# Patient Record
Sex: Male | Born: 1995 | Race: White | Hispanic: No | Marital: Single
Health system: Southern US, Community
[De-identification: ages and names within clinical notes are randomized; demographics above are authoritative.]

## PROBLEM LIST (undated history)

## (undated) DIAGNOSIS — F909 Attention-deficit hyperactivity disorder, unspecified type: Secondary | ICD-10-CM

---
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
## 2016-04-21 ENCOUNTER — Encounter: Payer: Medicaid Other | Admitting: Family Medicine

## 2016-04-29 ENCOUNTER — Encounter: Payer: Self-pay | Admitting: Family Medicine

## 2016-10-03 ENCOUNTER — Encounter (HOSPITAL_COMMUNITY): Payer: Self-pay

## 2016-10-03 ENCOUNTER — Emergency Department (HOSPITAL_COMMUNITY)
Admission: EM | Admit: 2016-10-03 | Discharge: 2016-10-03 | Disposition: A | Discharge status: Home / Self Care | Payer: Medicaid Other | Attending: Emergency Medicine | Admitting: Emergency Medicine

## 2016-10-03 DIAGNOSIS — R197 Diarrhea, unspecified: Secondary | ICD-10-CM

## 2016-10-03 DIAGNOSIS — Z79899 Other long term (current) drug therapy: Secondary | ICD-10-CM | POA: Insufficient documentation

## 2016-10-03 DIAGNOSIS — R112 Nausea with vomiting, unspecified: Secondary | ICD-10-CM

## 2016-10-03 DIAGNOSIS — E860 Dehydration: Secondary | ICD-10-CM | POA: Insufficient documentation

## 2016-10-03 DIAGNOSIS — Z7722 Contact with and (suspected) exposure to environmental tobacco smoke (acute) (chronic): Secondary | ICD-10-CM | POA: Insufficient documentation

## 2016-10-03 DIAGNOSIS — N179 Acute kidney failure, unspecified: Secondary | ICD-10-CM | POA: Insufficient documentation

## 2016-10-03 LAB — CBC WITH DIFFERENTIAL/PLATELET
BASOS ABS: 0 10*3/uL (ref 0.0–0.1)
BASOS PCT: 0 %
EOS ABS: 0 10*3/uL (ref 0.0–0.7)
Eosinophils Relative: 0 %
HCT: 50.7 % (ref 39.0–52.0)
HEMOGLOBIN: 18.8 g/dL — AB (ref 13.0–17.0)
Lymphocytes Relative: 5 %
Lymphs Abs: 0.8 10*3/uL (ref 0.7–4.0)
MCH: 32.7 pg (ref 26.0–34.0)
MCHC: 37.1 g/dL — ABNORMAL HIGH (ref 30.0–36.0)
MCV: 88.2 fL (ref 78.0–100.0)
Monocytes Absolute: 1.3 10*3/uL — ABNORMAL HIGH (ref 0.1–1.0)
Monocytes Relative: 8 %
NEUTROS PCT: 87 %
Neutro Abs: 14 10*3/uL — ABNORMAL HIGH (ref 1.7–7.7)
Platelets: 213 10*3/uL (ref 150–400)
RBC: 5.75 MIL/uL (ref 4.22–5.81)
RDW: 12.3 % (ref 11.5–15.5)
WBC: 16.1 10*3/uL — AB (ref 4.0–10.5)

## 2016-10-03 LAB — BASIC METABOLIC PANEL
Anion gap: 18 — ABNORMAL HIGH (ref 5–15)
Anion gap: 6 (ref 5–15)
BUN: 36 mg/dL — ABNORMAL HIGH (ref 6–20)
BUN: 29 mg/dL — AB (ref 6–20)
CALCIUM: 10 mg/dL (ref 8.9–10.3)
CALCIUM: 8.2 mg/dL — AB (ref 8.9–10.3)
CHLORIDE: 98 mmol/L — AB (ref 101–111)
CO2: 21 mmol/L — ABNORMAL LOW (ref 22–32)
CO2: 24 mmol/L (ref 22–32)
CREATININE: 2.04 mg/dL — AB (ref 0.61–1.24)
Chloride: 106 mmol/L (ref 101–111)
Creatinine, Ser: 1.44 mg/dL — ABNORMAL HIGH (ref 0.61–1.24)
GFR calc Af Amer: >60 mL/min (ref >60)
GFR, EST AFRICAN AMERICAN: 53 mL/min — AB (ref >60)
GFR, EST NON AFRICAN AMERICAN: 46 mL/min — AB (ref >60)
GLUCOSE: 122 mg/dL — AB (ref 65–99)
Glucose, Bld: 187 mg/dL — ABNORMAL HIGH (ref 65–99)
POTASSIUM: 4.9 mmol/L (ref 3.5–5.1)
Potassium: 4 mmol/L (ref 3.5–5.1)
SODIUM: 137 mmol/L (ref 135–145)
Sodium: 136 mmol/L (ref 135–145)

## 2016-10-03 LAB — URINALYSIS, ROUTINE W REFLEX MICROSCOPIC
Bacteria, UA: NONE SEEN
Bilirubin Urine: NEGATIVE
GLUCOSE, UA: NEGATIVE mg/dL
KETONES UR: NEGATIVE mg/dL
Leukocytes, UA: NEGATIVE
NITRITE: NEGATIVE
PH: 5 (ref 5.0–8.0)
PROTEIN: 30 mg/dL — AB
Specific Gravity, Urine: 1.021 (ref 1.005–1.030)

## 2016-10-03 MED ORDER — ONDANSETRON HCL 4 MG/2ML IJ SOLN
INTRAMUSCULAR | Status: AC
  Administered 2016-10-03: 4 mg
  Filled 2016-10-03: qty 2

## 2016-10-03 MED ORDER — SODIUM CHLORIDE 0.9 % IV BOLUS (SEPSIS)
1000.0000 mL | Freq: Once | INTRAVENOUS | Status: AC
  Administered 2016-10-03: 1000 mL via INTRAVENOUS

## 2016-10-03 MED ORDER — ONDANSETRON HCL 4 MG PO TABS: 4.0000 mg | ORAL_TABLET | Freq: Four times a day (QID) | ORAL | 0 refills | Status: DC | PRN

## 2016-10-03 NOTE — Discharge Instructions (Signed)
Drink plenty of fluids for the next several days. °

## 2016-10-03 NOTE — ED Notes (Signed)
Patient states that he is feeling better at this time.

## 2016-10-03 NOTE — ED Triage Notes (Signed)
Mother states that he has been vomiting every 30 minutes for the past 24 hours.  Did have diarrhea yesterday.

## 2016-10-03 NOTE — ED Provider Notes (Signed)
[facility]Y DEPT Provider Note   CSN: [id number] Arrival date & time: 10/03/16  0126     History   Chief Complaint Chief Complaint  Patient presents with  . Emesis    HPI Gregory English is a 21 y.o. male.  He had onset about 4 PM of nausea, vomiting, diarrhea. There is abdominal cramping but no steady abdominal pain. He denies fever but has had chills and sweats. He denies any sick contacts. Has been no treatment at home. Mother states that he has been vomiting about every 20-30 minutes.    Emesis      Past Medical History:  Diagnosis Date  . ADD (attention deficit disorder with hyperactivity)     Patient Active Problem List   Diagnosis Date Noted  . ADD (attention deficit disorder) 03/19/2013  . 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 No pertinent surgical history.     Home Medications    Prior to Admission medications   Medication Sig Start Date End Date Taking? Authorizing Provider  methylphenidate 36 MG PO CR tablet Take 1 tablet (36 mg total) by mouth every morning. 12/22/15   Babs Sciara, MD    Family History Family History  Problem Relation Age of Onset  . Arthritis    . Cancer    . Diabetes    . Kidney disease      Social History Social History  Substance Use Topics  . Smoking status: Passive Smoke Exposure - Never Smoker  . Smokeless tobacco: Never Used  . Alcohol use No     Allergies   Patient has no known allergies.   Review of Systems Review of Systems  Gastrointestinal: Positive for vomiting.  All other systems reviewed and are negative.    Physical Exam Updated Vital Signs BP 96/69 (BP Location: Right Arm)   Pulse (!) 142   Temp 98.3 F (36.8 C) (Oral)   Ht 6' (1.829 m)   Wt 165 lb (74.8 kg)   SpO2 100%   BMI 22.38 kg/m   Physical Exam  Nursing note and vitals reviewed.  21 year old male, resting comfortably and in no acute distress. Vital signs are significant for tachycardia. Oxygen  saturation is 100%, which is normal. Head is normocephalic and atraumatic. PERRLA, EOMI. Oropharynx is clear. Neck is nontender and supple without adenopathy or JVD. Back is nontender and there is no CVA tenderness. Lungs are clear without rales, wheezes, or rhonchi. Chest is nontender. Heart has regular rate and rhythm without murmur. Abdomen is soft, flat, nontender without masses or hepatosplenomegaly and peristalsis is hypoactive. Extremities have no cyanosis or edema, full range of motion is present. Skin is warm and dry without rash. Neurologic: Mental status is normal, cranial nerves are intact, there are no motor or sensory deficits.  ED Treatments / Results  Labs (all labs ordered are listed, but only abnormal results are displayed) Labs Reviewed  BASIC METABOLIC PANEL - Abnormal; Notable for the following:       Result Value   Chloride 98 (*)    CO2 21 (*)    Glucose, Bld 187 (*)    BUN 36 (*)    Creatinine, Ser 2.04 (*)    GFR calc non Af Amer 46 (*)    GFR calc Af Amer 53 (*)    Anion gap 18 (*)    All other components within normal limits  CBC WITH DIFFERENTIAL/PLATELET - Abnormal; Notable for the following:    WBC 16.1 (*)  Hemoglobin 18.8 (*)    MCHC 37.1 (*)    Neutro Abs 14.0 (*)    Monocytes Absolute 1.3 (*)    All other components within normal limits  URINALYSIS, ROUTINE W REFLEX MICROSCOPIC - Abnormal; Notable for the following:    APPearance HAZY (*)    Hgb urine dipstick SMALL (*)    Protein, ur 30 (*)    All other components within normal limits  BASIC METABOLIC PANEL - Abnormal; Notable for the following:    Glucose, Bld 122 (*)    BUN 29 (*)    Creatinine, Ser 1.44 (*)    Calcium 8.2 (*)    All other components within normal limits   Procedures Procedures (including critical care time)  Medications Ordered in ED Medications  ondansetron (ZOFRAN) 4 MG/2ML injection (not administered)  sodium chloride 0.9 % bolus 1,000 mL (not  administered)   Initial Impression / Assessment and Plan / ED Course  I have reviewed the triage vital signs and the nursing notes.  Pertinent labs & imaging results that were available during my care of the patient were reviewed by me and considered in my medical decision making (see chart for details).  Nausea, vomiting, diarrhea. Pattern is most consistent with viral gastroenteritis, consider food poisoning. No red flags to suggest more serious pathology. Screening labs are obtained and he will be given IV fluids and ondansetron.  Following above-noted treatment, he was feeling much better. Color had improved and he was no longer nauseated. However, laboratory workup showed significant renal insufficiency with creatinine 2.04 and BUN of 36, and elevated anion gap of 18. Patient was clinically doing well. He was given 2 more liters of IV fluids, and metabolic panel repeated. Creatinine had fallen to 1.44, BUN fallen to 29, and anion gap was down to 6. He was felt to be safe for discharge at this point. He is discharged with prescription for ondansetron and is advised to force fluids for the next several days. He is to see his PCP in the next week to recheck his metabolic panel.  Final Clinical Impressions(s) / ED Diagnoses   Final diagnoses:  Nausea vomiting and diarrhea  Dehydration  Acute kidney injury (nontraumatic) (HCC)    New Prescriptions New Prescriptions   ONDANSETRON (ZOFRAN) 4 MG TABLET    Take 1 tablet (4 mg total) by mouth every 6 (six) hours as needed.   ONDANSETRON (ZOFRAN) 4 MG TABLET    Take 1 tablet (4 mg total) by mouth every 6 (six) hours as needed.     Dione Boozeavid Luca Burston, MD 10/03/16 [phone]

## 2016-10-04 ENCOUNTER — Other Ambulatory Visit: Payer: Self-pay

## 2016-10-04 DIAGNOSIS — E860 Dehydration: Secondary | ICD-10-CM

## 2016-10-06 LAB — URINALYSIS
Bilirubin, UA: NEGATIVE
Glucose, UA: NEGATIVE
KETONES UA: NEGATIVE
LEUKOCYTES UA: NEGATIVE
NITRITE UA: NEGATIVE
PH UA: 5.5 (ref 5.0–7.5)
Protein, UA: NEGATIVE
RBC, UA: NEGATIVE
Specific Gravity, UA: 1.024 (ref 1.005–1.030)
Urobilinogen, Ur: 0.2 mg/dL (ref 0.2–1.0)

## 2016-10-06 LAB — BASIC METABOLIC PANEL
BUN / CREAT RATIO: 9 (ref 9–20)
BUN: 9 mg/dL (ref 6–20)
CO2: 24 mmol/L (ref 18–29)
Calcium: 9.3 mg/dL (ref 8.7–10.2)
Chloride: 103 mmol/L (ref 96–106)
Creatinine, Ser: 0.95 mg/dL (ref 0.76–1.27)
GFR, EST AFRICAN AMERICAN: 134 mL/min/{1.73_m2} (ref >59)
GFR, EST NON AFRICAN AMERICAN: 116 mL/min/{1.73_m2} (ref >59)
Glucose: 93 mg/dL (ref 65–99)
Potassium: 4.7 mmol/L (ref 3.5–5.2)
Sodium: 142 mmol/L (ref 134–144)

## 2016-10-07 ENCOUNTER — Ambulatory Visit (INDEPENDENT_AMBULATORY_CARE_PROVIDER_SITE_OTHER): Payer: Self-pay | Admitting: Family Medicine

## 2016-10-07 ENCOUNTER — Encounter: Payer: Self-pay | Admitting: Family Medicine

## 2016-10-07 VITALS — BP 100/74 | Temp 98.2°F | Ht 71.0 in | Wt 155.5 lb

## 2016-10-07 DIAGNOSIS — N289 Disorder of kidney and ureter, unspecified: Secondary | ICD-10-CM

## 2016-10-07 NOTE — Progress Notes (Signed)
   Subjective:    Patient ID: Gregory JubileeJonathan R Marcil, male    DOB: 06/10/1996, 21 y.o.   MRN: [id number]  HPI Patient is here today for a ER follow up visit. [facility] for nausea and vomiting. Patient recently had blood work completed. Patient states that he feels a lot better.  Patient has no other concerns at this time.  Patient had sudden onset of vomiting diarrhea not feeling good in the depth going to the ER diagnosed her renal insufficiency severe dehydration was given IV fluids he's doing much better now he is here for follow-up E didn't follow-up lab work and urinalysis which did not show any hematuria or proteinuria and creatinine looks good  Review of Systems  Constitutional: Negative for chills, diaphoresis, fatigue and fever.  HENT: Negative for congestion.   Respiratory: Negative for cough and shortness of breath.   Cardiovascular: Negative for chest pain.  Gastrointestinal: Negative for abdominal pain.       Objective:   Physical Exam  Constitutional: He appears well-nourished. No distress.  Cardiovascular: Normal rate, regular rhythm and normal heart sounds.   No murmur heard. Pulmonary/Chest: Effort normal and breath sounds normal. No respiratory distress.  Abdominal: Soft. He exhibits no distension. There is no tenderness. There is no rebound and no guarding.  Musculoskeletal: He exhibits no edema.  Lymphadenopathy:    He has no cervical adenopathy.  Neurological: He is alert.  Psychiatric: His behavior is normal.  Vitals reviewed.         Assessment & Plan:  Recent viral gastroenteritis resolved Recent renal is in insufficiency resolved No need to do any further lab testing at that time follow-up if problems

## 2018-01-24 ENCOUNTER — Encounter: Payer: Self-pay | Admitting: Family Medicine

## 2018-01-24 ENCOUNTER — Ambulatory Visit: Payer: Self-pay | Admitting: Family Medicine

## 2018-01-24 VITALS — BP 120/78 | Temp 98.1°F | Ht 71.0 in | Wt 159.0 lb

## 2018-01-24 DIAGNOSIS — H6123 Impacted cerumen, bilateral: Secondary | ICD-10-CM

## 2018-01-24 NOTE — Progress Notes (Signed)
   Subjective:    Patient ID: Gregory English, male    DOB: 08-Sep-1995, 22 y.o.   MRN: [id number]  HPI  Patient is here today to discuss issues with ear wax buildup in both ears.He states it has been on going all his life. Bilateral difficulty hearing in the ears.  Denies high fever chills sweats Review of Systems No wheezing no difficulty breathing no fevers no vomiting    Objective:   Physical Exam  Bilateral cerumen impaction neck no masses lungs clear crackles are not present heart is regular no murmurs      Assessment & Plan:  Bilateral cerumen impaction Referral to ENT for further evaluation Follow-up if ongoing troubles

## 2018-01-24 NOTE — Patient Instructions (Signed)
As part of today's visit a referral has been made. This is a process that is handled by our clinical referral specialists. This process requires that we send your medical information to the specialists for their review before they will issue you an appointment.  Unfortunately this does take time and much of this process is under the responsibility of the specialists.  For emergent referrals we do our best to speed up this process. Our referral specialist will make certain that your insurance company preferred list is taken into account. Emergent referrals are made as quick as possible.  Most standard referrals often take 10-14 days before the specialists office will connect with you to set up the appointment.. If you have not heard when your appointment is from us or the referral specialists within 10-14 days please call us regarding this referral.   

## 2020-09-18 ENCOUNTER — Encounter (HOSPITAL_COMMUNITY): Payer: Self-pay | Admitting: Emergency Medicine

## 2020-09-18 ENCOUNTER — Other Ambulatory Visit: Payer: Self-pay

## 2020-09-18 DIAGNOSIS — E860 Dehydration: Secondary | ICD-10-CM | POA: Insufficient documentation

## 2020-09-18 DIAGNOSIS — Z7722 Contact with and (suspected) exposure to environmental tobacco smoke (acute) (chronic): Secondary | ICD-10-CM | POA: Insufficient documentation

## 2020-09-18 DIAGNOSIS — R1010 Upper abdominal pain, unspecified: Secondary | ICD-10-CM | POA: Insufficient documentation

## 2020-09-18 DIAGNOSIS — R197 Diarrhea, unspecified: Secondary | ICD-10-CM | POA: Insufficient documentation

## 2020-09-18 DIAGNOSIS — R112 Nausea with vomiting, unspecified: Secondary | ICD-10-CM | POA: Insufficient documentation

## 2020-09-18 LAB — CBC
HCT: 48.8 % (ref 39.0–52.0)
Hemoglobin: 17 g/dL (ref 13.0–17.0)
MCH: 31.4 pg (ref 26.0–34.0)
MCHC: 34.8 g/dL (ref 30.0–36.0)
MCV: 90.2 fL (ref 80.0–100.0)
Platelets: 183 10*3/uL (ref 150–400)
RBC: 5.41 MIL/uL (ref 4.22–5.81)
RDW: 12 % (ref 11.5–15.5)
WBC: 12.4 10*3/uL — ABNORMAL HIGH (ref 4.0–10.5)
nRBC: 0 % (ref 0.0–0.2)

## 2020-09-18 LAB — COMPREHENSIVE METABOLIC PANEL
ALT: 35 U/L (ref 0–44)
AST: 36 U/L (ref 15–41)
Albumin: 4.7 g/dL (ref 3.5–5.0)
Alkaline Phosphatase: 53 U/L (ref 38–126)
Anion gap: 11 (ref 5–15)
BUN: 18 mg/dL (ref 6–20)
CO2: 27 mmol/L (ref 22–32)
Calcium: 9.5 mg/dL (ref 8.9–10.3)
Chloride: 97 mmol/L — ABNORMAL LOW (ref 98–111)
Creatinine, Ser: 1.25 mg/dL — ABNORMAL HIGH (ref 0.61–1.24)
GFR, Estimated: >60 mL/min (ref >60)
Glucose, Bld: 155 mg/dL — ABNORMAL HIGH (ref 70–99)
Potassium: 4.4 mmol/L (ref 3.5–5.1)
Sodium: 135 mmol/L (ref 135–145)
Total Bilirubin: 0.7 mg/dL (ref 0.3–1.2)
Total Protein: 8.1 g/dL (ref 6.5–8.1)

## 2020-09-18 LAB — LIPASE, BLOOD: Lipase: 25 U/L (ref 11–51)

## 2020-09-18 NOTE — ED Triage Notes (Signed)
Pt states he started vomiting since about 1730 this afternoon. Denies COVID exposure.

## 2020-09-19 ENCOUNTER — Emergency Department (HOSPITAL_COMMUNITY)
Admission: EM | Admit: 2020-09-19 | Discharge: 2020-09-19 | Disposition: A | Discharge status: Home / Self Care | Payer: Self-pay | Attending: Emergency Medicine | Admitting: Emergency Medicine

## 2020-09-19 DIAGNOSIS — R112 Nausea with vomiting, unspecified: Secondary | ICD-10-CM

## 2020-09-19 DIAGNOSIS — E860 Dehydration: Secondary | ICD-10-CM

## 2020-09-19 DIAGNOSIS — R197 Diarrhea, unspecified: Secondary | ICD-10-CM

## 2020-09-19 MED ORDER — ONDANSETRON HCL 4 MG PO TABS: 4.0000 mg | ORAL_TABLET | Freq: Three times a day (TID) | ORAL | 0 refills | Status: DC | PRN

## 2020-09-19 MED ORDER — SODIUM CHLORIDE 0.9 % IV BOLUS
1000.0000 mL | Freq: Once | INTRAVENOUS | Status: AC
  Administered 2020-09-19: 1000 mL via INTRAVENOUS

## 2020-09-19 MED ORDER — ONDANSETRON HCL 4 MG/2ML IJ SOLN
4.0000 mg | Freq: Once | INTRAMUSCULAR | Status: AC
  Administered 2020-09-19: 4 mg via INTRAVENOUS
  Filled 2020-09-19: qty 2

## 2020-09-19 NOTE — ED Notes (Signed)
Pt tolerating water intake.

## 2020-09-19 NOTE — ED Provider Notes (Signed)
[facility] EMERGENCY DEPARTMENT Provider Note   CSN: [id number] Arrival date & time: 09/18/20  2047   Time seen 2:15 AM  History Chief Complaint  Patient presents with  . Emesis    Gregory English is a 25 y.o. male.  HPI   Patient states he was fine all day and at 5:30 PM tonight, January 20 he started vomiting twice and then has been having dry heaves.  He had diarrhea 3-4 times.  He states he had some mild upper abdominal pain but that is gone now.  He states it was constant and a pressure feeling.  He denies fever, cough, sore throat, feeling dizzy or lightheaded.  At the time of my exam he states he was feeling better.  PCP Babs Sciara, MD    Past Medical History:  Diagnosis Date  . ADD (attention deficit disorder with hyperactivity)     Patient Active Problem List   Diagnosis Date Noted  . ADD (attention deficit disorder) 03/19/2013  . 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 No pertinent surgical history.     Family History  Problem Relation Age of Onset  . Arthritis Other   . Cancer Other   . Diabetes Other   . Kidney disease Other     Social History   Tobacco Use  . Smoking status: Passive Smoke Exposure - Never Smoker  . Smokeless tobacco: Never Used  Substance Use Topics  . Alcohol use: No  . Drug use: No    Home Medications Prior to Admission medications   Medication Sig Start Date End Date Taking? Authorizing Provider  ondansetron (ZOFRAN) 4 MG tablet Take 1 tablet (4 mg total) by mouth every 8 (eight) hours as needed. 09/19/20  Yes Devoria Albe, MD    Allergies    Patient has no known allergies.  Review of Systems   Review of Systems  All other systems reviewed and are negative.   Physical Exam Updated Vital Signs BP 115/75   Pulse 86   Temp 98.6 F (37 C)   Resp 14   SpO2 99%   Physical Exam Vitals and nursing note reviewed.  Constitutional:      General: He is not in acute distress.    Appearance:  Normal appearance. He is normal weight.     Comments: Patient was sleeping when I entered the room, he did not awaken to verbal stimulus I had to shake his foot.  HENT:     Head: Normocephalic and atraumatic.     Right Ear: External ear normal.     Left Ear: External ear normal.  Eyes:     Extraocular Movements: Extraocular movements intact.     Conjunctiva/sclera: Conjunctivae normal.     Pupils: Pupils are equal, round, and reactive to light.  Cardiovascular:     Rate and Rhythm: Normal rate and regular rhythm.     Pulses: Normal pulses.     Heart sounds: Normal heart sounds.  Pulmonary:     Effort: Pulmonary effort is normal.  Abdominal:     General: Abdomen is flat.     Palpations: Abdomen is soft.     Tenderness: There is no abdominal tenderness.  Musculoskeletal:        General: Normal range of motion.     Cervical back: Normal range of motion.  Skin:    General: Skin is warm and dry.  Neurological:     General: No focal deficit present.     Mental  Status: He is alert and oriented to person, place, and time.     Cranial Nerves: No cranial nerve deficit.  Psychiatric:        Mood and Affect: Mood normal.        Behavior: Behavior normal.        Thought Content: Thought content normal.     ED Results / Procedures / Treatments   Labs (all labs ordered are listed, but only abnormal results are displayed) Results for orders placed or performed during the hospital encounter of 09/19/20  Lipase, blood  Result Value Ref Range   Lipase 25 11 - 51 U/L  Comprehensive metabolic panel  Result Value Ref Range   Sodium 135 135 - 145 mmol/L   Potassium 4.4 3.5 - 5.1 mmol/L   Chloride 97 (L) 98 - 111 mmol/L   CO2 27 22 - 32 mmol/L   Glucose, Bld 155 (H) 70 - 99 mg/dL   BUN 18 6 - 20 mg/dL   Creatinine, Ser 7.82 (H) 0.61 - 1.24 mg/dL   Calcium 9.5 8.9 - 42.3 mg/dL   Total Protein 8.1 6.5 - 8.1 g/dL   Albumin 4.7 3.5 - 5.0 g/dL   AST 36 15 - 41 U/L   ALT 35 0 - 44 U/L    Alkaline Phosphatase 53 38 - 126 U/L   Total Bilirubin 0.7 0.3 - 1.2 mg/dL   GFR, Estimated >53 >61 mL/min   Anion gap 11 5 - 15  CBC  Result Value Ref Range   WBC 12.4 (H) 4.0 - 10.5 K/uL   RBC 5.41 4.22 - 5.81 MIL/uL   Hemoglobin 17.0 13.0 - 17.0 g/dL   HCT 44.3 15.4 - 00.8 %   MCV 90.2 80.0 - 100.0 fL   MCH 31.4 26.0 - 34.0 pg   MCHC 34.8 30.0 - 36.0 g/dL   RDW 67.6 19.5 - 09.3 %   Platelets 183 150 - 400 K/uL   nRBC 0.0 0.0 - 0.2 %   Laboratory interpretation all normal except leukocytosis consistent with vomiting, hyperglycemia    EKG EKG Interpretation  Date/Time:  Friday September 19 2020 00:54:27 EST Ventricular Rate:  85 PR Interval:    QRS Duration: 84 QT Interval:  340 QTC Calculation: 405 R Axis:   102 Text Interpretation: Sinus rhythm Right atrial enlargement Borderline right axis deviation Borderline T wave abnormalities Borderline ST elevation, anterolateral leads No old tracing to compare Confirmed by Devoria Albe ([id number]) on 09/19/2020 1:57:49 AM   Radiology No results found.  Procedures Procedures (including critical care time)  Medications Ordered in ED Medications  sodium chloride 0.9 % bolus 1,000 mL (0 mLs Intravenous Stopped 09/19/20 0455)  ondansetron (ZOFRAN) injection 4 mg (4 mg Intravenous Given 09/19/20 0238)  sodium chloride 0.9 % bolus 1,000 mL (1,000 mLs Intravenous New Bag/Given 09/19/20 0453)    ED Course  I have reviewed the triage vital signs and the nursing notes.  Pertinent labs & imaging results that were available during my care of the patient were reviewed by me and considered in my medical decision making (see chart for details).    MDM Rules/Calculators/A&P                         At the time of my exam patient stated he felt better.  I ordered IV fluids and he wanted to try oral intake.  However almost as soon as I left the room the nurses said he  reported he started getting very nauseated and started vomiting again.  He was  given Zofran IV.  Patient does have a slight increase of his creatinine with a normal GFR consistent with dehydration.  Recheck at 5:00 AM patient is awake, he states he feels better.  He has been drinking fluids without issue.  He feels ready to be discharged.  Final Clinical Impression(s) / ED Diagnoses Final diagnoses:  Nausea vomiting and diarrhea  Dehydration    Rx / DC Orders ED Discharge Orders         Ordered    ondansetron (ZOFRAN) 4 MG tablet  Every 8 hours PRN        09/19/20 0525         Plan discharge  Devoria Albe, MD, Concha Pyo, MD 09/19/20 [phone]

## 2020-09-19 NOTE — Discharge Instructions (Addendum)
Drink plenty of fluids (clear liquids) then start a bland diet later this morning such as toast, crackers, jello, [employer] chicken noodle soup. Use the zofran for nausea or vomiting. Take imodium OTC for diarrhea. Avoid milk products until the diarrhea is gone. ° °

## 2023-02-26 ENCOUNTER — Encounter (HOSPITAL_COMMUNITY): Payer: Self-pay

## 2023-02-26 ENCOUNTER — Other Ambulatory Visit: Payer: Self-pay

## 2023-02-26 ENCOUNTER — Emergency Department (HOSPITAL_COMMUNITY)
Admission: EM | Admit: 2023-02-26 | Discharge: 2023-02-26 | Disposition: A | Discharge status: Home / Self Care | Payer: Self-pay | Attending: Emergency Medicine | Admitting: Emergency Medicine

## 2023-02-26 DIAGNOSIS — X30XXXA Exposure to excessive natural heat, initial encounter: Secondary | ICD-10-CM | POA: Insufficient documentation

## 2023-02-26 DIAGNOSIS — T675XXA Heat exhaustion, unspecified, initial encounter: Secondary | ICD-10-CM | POA: Insufficient documentation

## 2023-02-26 DIAGNOSIS — R7989 Other specified abnormal findings of blood chemistry: Secondary | ICD-10-CM | POA: Insufficient documentation

## 2023-02-26 LAB — URINALYSIS, ROUTINE W REFLEX MICROSCOPIC
Bacteria, UA: NONE SEEN
Bilirubin Urine: NEGATIVE
Glucose, UA: NEGATIVE mg/dL
Hgb urine dipstick: NEGATIVE
Ketones, ur: 5 mg/dL — AB
Leukocytes,Ua: NEGATIVE
Nitrite: NEGATIVE
Protein, ur: 100 mg/dL — AB
Specific Gravity, Urine: 1.032 — ABNORMAL HIGH (ref 1.005–1.030)
pH: 5 (ref 5.0–8.0)

## 2023-02-26 LAB — CBC WITH DIFFERENTIAL/PLATELET
Abs Immature Granulocytes: 0.12 10*3/uL — ABNORMAL HIGH (ref 0.00–0.07)
Basophils Absolute: 0.1 10*3/uL (ref 0.0–0.1)
Basophils Relative: 1 %
Eosinophils Absolute: 0 10*3/uL (ref 0.0–0.5)
Eosinophils Relative: 0 %
HCT: 43.9 % (ref 39.0–52.0)
Hemoglobin: 16 g/dL (ref 13.0–17.0)
Immature Granulocytes: 1 %
Lymphocytes Relative: 12 %
Lymphs Abs: 1.5 10*3/uL (ref 0.7–4.0)
MCH: 31.4 pg (ref 26.0–34.0)
MCHC: 36.4 g/dL — ABNORMAL HIGH (ref 30.0–36.0)
MCV: 86.2 fL (ref 80.0–100.0)
Monocytes Absolute: 1.2 10*3/uL — ABNORMAL HIGH (ref 0.1–1.0)
Monocytes Relative: 10 %
Neutro Abs: 9.5 10*3/uL — ABNORMAL HIGH (ref 1.7–7.7)
Neutrophils Relative %: 76 %
Platelets: 237 10*3/uL (ref 150–400)
RBC: 5.09 MIL/uL (ref 4.22–5.81)
RDW: 12 % (ref 11.5–15.5)
WBC: 12.4 10*3/uL — ABNORMAL HIGH (ref 4.0–10.5)
nRBC: 0 % (ref 0.0–0.2)

## 2023-02-26 LAB — COMPREHENSIVE METABOLIC PANEL
ALT: 30 U/L (ref 0–44)
AST: 37 U/L (ref 15–41)
Albumin: 5.1 g/dL — ABNORMAL HIGH (ref 3.5–5.0)
Alkaline Phosphatase: 60 U/L (ref 38–126)
Anion gap: 13 (ref 5–15)
BUN: 15 mg/dL (ref 6–20)
CO2: 24 mmol/L (ref 22–32)
Calcium: 10 mg/dL (ref 8.9–10.3)
Chloride: 100 mmol/L (ref 98–111)
Creatinine, Ser: 1.35 mg/dL — ABNORMAL HIGH (ref 0.61–1.24)
GFR, Estimated: >60 mL/min (ref >60)
Glucose, Bld: 85 mg/dL (ref 70–99)
Potassium: 3.5 mmol/L (ref 3.5–5.1)
Sodium: 137 mmol/L (ref 135–145)
Total Bilirubin: 1.3 mg/dL — ABNORMAL HIGH (ref 0.3–1.2)
Total Protein: 8.8 g/dL — ABNORMAL HIGH (ref 6.5–8.1)

## 2023-02-26 LAB — MAGNESIUM: Magnesium: 2.1 mg/dL (ref 1.7–2.4)

## 2023-02-26 LAB — CK: Total CK: 292 U/L (ref 49–397)

## 2023-02-26 MED ORDER — SODIUM CHLORIDE 0.9 % IV BOLUS
1000.0000 mL | Freq: Once | INTRAVENOUS | Status: AC
  Administered 2023-02-26: 1000 mL via INTRAVENOUS

## 2023-02-26 MED ORDER — ONDANSETRON HCL 4 MG/2ML IJ SOLN: 4.0000 mg | Freq: Once | INTRAMUSCULAR | Status: DC

## 2023-02-26 NOTE — ED Notes (Signed)
Unable to give ua sample at this time.

## 2023-02-26 NOTE — ED Triage Notes (Signed)
Pt to ED from working out in sun all day,pt says he feels weak and shaking, says he has been drinking fluids and urine is not dark, but feels shaky and weak.

## 2023-02-26 NOTE — ED Provider Notes (Signed)
EMERGENCY DEPARTMENT AT [facility] Provider Note   CSN: [id number] Arrival date & time: 02/26/23  2049     History {Add pertinent medical, surgical, social history, OB history to HPI:1} Chief Complaint  Patient presents with   Heat Exposure    KALEP SUMMERTON is a 27 y.o. male.  Pt is a 27 yo male with pmhx significant for ADHD.  He was outside in the heat working on his truck.  He has drank some fluids, but came into the house and feels weak, shaky, and nauseous.  He was concerned he was dehydrated.       Home Medications Prior to Admission medications   Medication Sig Start Date End Date Taking? Authorizing Provider  ondansetron (ZOFRAN) 4 MG tablet Take 1 tablet (4 mg total) by mouth every 8 (eight) hours as needed. 09/19/20   Devoria Albe, MD      Allergies    Patient has no known allergies.    Review of Systems   Review of Systems  Constitutional:  Positive for chills.  Gastrointestinal:  Positive for nausea.  Neurological:  Positive for weakness.  All other systems reviewed and are negative.   Physical Exam Updated Vital Signs BP 126/89   Pulse 82   Temp (!) 97.4 F (36.3 C) (Oral)   Resp 17   Ht 6' (1.829 m)   Wt 79.4 kg   SpO2 96%   BMI 23.73 kg/m  Physical Exam Vitals and nursing note reviewed.  Constitutional:      Appearance: Normal appearance.  HENT:     Head: Normocephalic and atraumatic.     Right Ear: External ear normal.     Left Ear: External ear normal.     Nose: Nose normal.     Mouth/Throat:     Mouth: Mucous membranes are dry.  Eyes:     Extraocular Movements: Extraocular movements intact.     Conjunctiva/sclera: Conjunctivae normal.     Pupils: Pupils are equal, round, and reactive to light.  Cardiovascular:     Rate and Rhythm: Normal rate and regular rhythm.     Pulses: Normal pulses.     Heart sounds: Normal heart sounds.  Pulmonary:     Effort: Pulmonary effort is normal.     Breath sounds: Normal  breath sounds.  Abdominal:     General: Abdomen is flat. Bowel sounds are normal.     Palpations: Abdomen is soft.  Musculoskeletal:        General: Normal range of motion.     Cervical back: Normal range of motion and neck supple.  Skin:    General: Skin is warm.     Capillary Refill: Capillary refill takes less than 2 seconds.  Neurological:     General: No focal deficit present.     Mental Status: He is alert and oriented to person, place, and time.  Psychiatric:        Mood and Affect: Mood normal.        Behavior: Behavior normal.     ED Results / Procedures / Treatments   Labs (all labs ordered are listed, but only abnormal results are displayed) Labs Reviewed  COMPREHENSIVE METABOLIC PANEL  CBC WITH DIFFERENTIAL/PLATELET  MAGNESIUM  URINALYSIS, ROUTINE W REFLEX MICROSCOPIC  CK    EKG None  Radiology No results found.  Procedures Procedures  {Document cardiac monitor, telemetry assessment procedure when appropriate:1}  Medications Ordered in ED Medications  sodium chloride 0.9 % bolus 1,000  mL (has no administration in time range)    ED Course/ Medical Decision Making/ A&P   {   Click here for ABCD2, HEART and other calculatorsREFRESH Note before signing :1}                          Medical Decision Making Amount and/or Complexity of Data Reviewed Labs: ordered.  Risk Prescription drug management.   This patient presents to the ED for concern of dehydration, this involves an extensive number of treatment options, and is a complaint that carries with it a high risk of complications and morbidity.  The differential diagnosis includes heat exhaustion, electrolyte abn, rhabdo   Co morbidities that complicate the patient evaluation  adhd   Additional history obtained:  Additional history obtained from epic chart review External records from outside source obtained and reviewed including family   Lab Tests:  I Ordered, and personally  interpreted labs.  The pertinent results include:  ***   Imaging Studies ordered:  I ordered imaging studies including ***  I independently visualized and interpreted imaging which showed *** I agree with the radiologist interpretation   Cardiac Monitoring:  The patient was maintained on a cardiac monitor.  I personally viewed and interpreted the cardiac monitored which showed an underlying rhythm of: ***   Medicines ordered and prescription drug management:  I ordered medication including ***  for ***  Reevaluation of the patient after these medicines showed that the patient {resolved/improved/worsened:23923::"improved"} I have reviewed the patients home medicines and have made adjustments as needed   Test Considered:  ***   Critical Interventions:  ***   Consultations Obtained:  I requested consultation with the ***,  and discussed lab and imaging findings as well as pertinent plan - they recommend: ***   Problem List / ED Course:  ***   Reevaluation:  After the interventions noted above, I reevaluated the patient and found that they have :{resolved/improved/worsened:23923::"improved"}   Social Determinants of Health:  ***   Dispostion:  After consideration of the diagnostic results and the patients response to treatment, I feel that the patent would benefit from ***.    {Document critical care time when appropriate:1} {Document review of labs and clinical decision tools ie heart score, Chads2Vasc2 etc:1}  {Document your independent review of radiology images, and any outside records:1} {Document your discussion with family members, caretakers, and with consultants:1} {Document social determinants of health affecting pt's care:1} {Document your decision making why or why not admission, treatments were needed:1} Final Clinical Impression(s) / ED Diagnoses Final diagnoses:  None    Rx / DC Orders ED Discharge Orders     None

## 2023-10-23 ENCOUNTER — Other Ambulatory Visit: Payer: Self-pay

## 2023-10-23 ENCOUNTER — Encounter (HOSPITAL_COMMUNITY): Payer: Self-pay

## 2023-10-23 ENCOUNTER — Emergency Department (HOSPITAL_COMMUNITY)
Admission: EM | Admit: 2023-10-23 | Discharge: 2023-10-23 | Disposition: A | Discharge status: Home / Self Care | Payer: Self-pay | Attending: Emergency Medicine | Admitting: Emergency Medicine

## 2023-10-23 DIAGNOSIS — J029 Acute pharyngitis, unspecified: Secondary | ICD-10-CM | POA: Diagnosis not present

## 2023-10-23 DIAGNOSIS — R112 Nausea with vomiting, unspecified: Secondary | ICD-10-CM

## 2023-10-23 DIAGNOSIS — K529 Noninfective gastroenteritis and colitis, unspecified: Secondary | ICD-10-CM | POA: Insufficient documentation

## 2023-10-23 DIAGNOSIS — R197 Diarrhea, unspecified: Secondary | ICD-10-CM | POA: Diagnosis present

## 2023-10-23 LAB — COMPREHENSIVE METABOLIC PANEL
ALT: 28 U/L (ref 0–44)
AST: 31 U/L (ref 15–41)
Albumin: 4.8 g/dL (ref 3.5–5.0)
Alkaline Phosphatase: 50 U/L (ref 38–126)
Anion gap: 14 (ref 5–15)
BUN: 14 mg/dL (ref 6–20)
CO2: 19 mmol/L — ABNORMAL LOW (ref 22–32)
Calcium: 9.7 mg/dL (ref 8.9–10.3)
Chloride: 105 mmol/L (ref 98–111)
Creatinine, Ser: 1.11 mg/dL (ref 0.61–1.24)
GFR, Estimated: >60 mL/min (ref >60)
Glucose, Bld: 114 mg/dL — ABNORMAL HIGH (ref 70–99)
Potassium: 3.5 mmol/L (ref 3.5–5.1)
Sodium: 138 mmol/L (ref 135–145)
Total Bilirubin: 1 mg/dL (ref 0.0–1.2)
Total Protein: 7.8 g/dL (ref 6.5–8.1)

## 2023-10-23 LAB — CBC
HCT: 45.4 % (ref 39.0–52.0)
Hemoglobin: 16.3 g/dL (ref 13.0–17.0)
MCH: 30.9 pg (ref 26.0–34.0)
MCHC: 35.9 g/dL (ref 30.0–36.0)
MCV: 86 fL (ref 80.0–100.0)
Platelets: 214 10*3/uL (ref 150–400)
RBC: 5.28 MIL/uL (ref 4.22–5.81)
RDW: 11.8 % (ref 11.5–15.5)
WBC: 8.1 10*3/uL (ref 4.0–10.5)
nRBC: 0 % (ref 0.0–0.2)

## 2023-10-23 LAB — URINALYSIS, ROUTINE W REFLEX MICROSCOPIC
Bacteria, UA: NONE SEEN
Bilirubin Urine: NEGATIVE
Glucose, UA: NEGATIVE mg/dL
Hgb urine dipstick: NEGATIVE
Ketones, ur: 20 mg/dL — AB
Leukocytes,Ua: NEGATIVE
Nitrite: NEGATIVE
Protein, ur: 100 mg/dL — AB
Specific Gravity, Urine: 1.031 — ABNORMAL HIGH (ref 1.005–1.030)
pH: 5 (ref 5.0–8.0)

## 2023-10-23 LAB — LIPASE, BLOOD: Lipase: 23 U/L (ref 11–51)

## 2023-10-23 MED ORDER — ONDANSETRON 4 MG PO TBDP: ORAL_TABLET | ORAL | 0 refills | Status: DC

## 2023-10-23 MED ORDER — SODIUM CHLORIDE 0.9 % IV BOLUS
2000.0000 mL | Freq: Once | INTRAVENOUS | Status: AC
  Administered 2023-10-23: 2000 mL via INTRAVENOUS

## 2023-10-23 MED ORDER — ONDANSETRON HCL 4 MG/2ML IJ SOLN
4.0000 mg | Freq: Once | INTRAMUSCULAR | Status: AC | PRN
  Administered 2023-10-23: 4 mg via INTRAVENOUS
  Filled 2023-10-23: qty 2

## 2023-10-23 MED ORDER — KETOROLAC TROMETHAMINE 30 MG/ML IJ SOLN
15.0000 mg | Freq: Once | INTRAMUSCULAR | Status: AC
  Administered 2023-10-23: 15 mg via INTRAVENOUS
  Filled 2023-10-23: qty 1

## 2023-10-23 NOTE — ED Triage Notes (Signed)
 Pt having a sore throat, runny nose and around noon started vomiting with diarrhea.

## 2023-10-23 NOTE — Discharge Instructions (Signed)
 Drink plenty of fluids.  Take Tylenol or Motrin for fever or aches.  Follow-up if not improving.  You have been prescribed some nausea medicine

## 2023-10-23 NOTE — ED Notes (Signed)
 ED Provider at bedside.

## 2023-10-23 NOTE — ED Provider Notes (Signed)
 [place] EMERGENCY DEPARTMENT AT [facility] Provider Note   CSN: [id number] Arrival date & time: 10/23/23  1743     History  Chief Complaint  Patient presents with   Emesis    Gregory English is a 28 y.o. male.  Patient complains of vomiting and diarrhea since noon today.  Some sore throat  The history is provided by the patient and medical records. No language interpreter was used.  Emesis Severity:  Moderate Timing:  Constant Quality:  Bilious material Able to tolerate:  Liquids Progression:  Unchanged Chronicity:  New Recent urination:  Normal Context: not post-tussive   Relieved by:  Nothing Worsened by:  Nothing Associated symptoms: abdominal pain and diarrhea   Associated symptoms: no cough and no headaches   Risk factors: no alcohol use        Home Medications Prior to Admission medications   Medication Sig Start Date End Date Taking? Authorizing Provider  ondansetron (ZOFRAN-ODT) 4 MG disintegrating tablet 4mg  ODT q4 hours prn nausea/vomit 10/23/23  Yes Bethann Berkshire, MD  ondansetron (ZOFRAN) 4 MG tablet Take 1 tablet (4 mg total) by mouth every 8 (eight) hours as needed. 09/19/20   Devoria Albe, MD      Allergies    Patient has no known allergies.    Review of Systems   Review of Systems  Constitutional:  Negative for appetite change and fatigue.  HENT:  Negative for congestion, ear discharge and sinus pressure.   Eyes:  Negative for discharge.  Respiratory:  Negative for cough.   Cardiovascular:  Negative for chest pain.  Gastrointestinal:  Positive for abdominal pain, diarrhea and vomiting.  Genitourinary:  Negative for frequency and hematuria.  Musculoskeletal:  Negative for back pain.  Skin:  Negative for rash.  Neurological:  Negative for seizures and headaches.  Psychiatric/Behavioral:  Negative for hallucinations.     Physical Exam Updated Vital Signs BP (!) 101/58   Pulse 73   Temp 99.2 F (37.3 C) (Oral)   Resp 17    SpO2 97%  Physical Exam Vitals and nursing note reviewed.  Constitutional:      Appearance: He is well-developed.  HENT:     Head: Normocephalic.     Nose: Nose normal.  Eyes:     General: No scleral icterus.    Conjunctiva/sclera: Conjunctivae normal.  Neck:     Thyroid: No thyromegaly.  Cardiovascular:     Rate and Rhythm: Normal rate and regular rhythm.     Heart sounds: No murmur heard.    No friction rub. No gallop.  Pulmonary:     Breath sounds: No stridor. No wheezing or rales.  Chest:     Chest wall: No tenderness.  Abdominal:     General: There is no distension.     Tenderness: There is abdominal tenderness. There is no rebound.  Musculoskeletal:        General: Normal range of motion.     Cervical back: Neck supple.  Lymphadenopathy:     Cervical: No cervical adenopathy.  Skin:    Findings: No erythema or rash.  Neurological:     Mental Status: He is alert and oriented to person, place, and time.     Motor: No abnormal muscle tone.     Coordination: Coordination normal.  Psychiatric:        Behavior: Behavior normal.     ED Results / Procedures / Treatments   Labs (all labs ordered are listed, but only abnormal results  are displayed) Labs Reviewed  COMPREHENSIVE METABOLIC PANEL - Abnormal; Notable for the following components:      Result Value   CO2 19 (*)    Glucose, Bld 114 (*)    All other components within normal limits  URINALYSIS, ROUTINE W REFLEX MICROSCOPIC - Abnormal; Notable for the following components:   Color, Urine AMBER (*)    APPearance CLOUDY (*)    Specific Gravity, Urine 1.031 (*)    Ketones, ur 20 (*)    Protein, ur 100 (*)    Non Squamous Epithelial 6-10 (*)    All other components within normal limits  LIPASE, BLOOD  CBC    EKG None  Radiology No results found.  Procedures Procedures    Medications Ordered in ED Medications  ondansetron (ZOFRAN) injection 4 mg (4 mg Intravenous Given 10/23/23 1826)  sodium  chloride 0.9 % bolus 2,000 mL (0 mLs Intravenous Stopped 10/23/23 2035)  ketorolac (TORADOL) 30 MG/ML injection 15 mg (15 mg Intravenous Given 10/23/23 1832)    ED Course/ Medical Decision Making/ A&P                                 Medical Decision Making Amount and/or Complexity of Data Reviewed Labs: ordered.  Risk Prescription drug management.  Patient with gastroenteritis.  He improved with IV fluids and Toradol.  He will be discharged home with follow-up as needed.  Patient given Zofran as a prescription as needed        Final Clinical Impression(s) / ED Diagnoses Final diagnoses:  Nausea vomiting and diarrhea  Gastroenteritis    Rx / DC Orders ED Discharge Orders          Ordered    ondansetron (ZOFRAN-ODT) 4 MG disintegrating tablet        10/23/23 2137              Bethann Berkshire, MD 10/30/23 1717

## 2024-06-18 ENCOUNTER — Ambulatory Visit: Admitting: Physician Assistant

## 2024-06-18 ENCOUNTER — Encounter: Payer: Self-pay | Admitting: Physician Assistant

## 2024-06-18 VITALS — BP 122/80 | HR 63 | Temp 97.5°F | Ht 72.0 in | Wt 169.0 lb

## 2024-06-18 DIAGNOSIS — Z7689 Persons encountering health services in other specified circumstances: Secondary | ICD-10-CM

## 2024-06-18 DIAGNOSIS — Z0000 Encounter for general adult medical examination without abnormal findings: Secondary | ICD-10-CM

## 2024-06-18 DIAGNOSIS — Z23 Encounter for immunization: Secondary | ICD-10-CM | POA: Diagnosis not present

## 2024-06-18 NOTE — Progress Notes (Signed)
 Complete physical exam  Patient: Gregory English   DOB: 11/10/1995   28 y.o. Male  MRN: [id number]  Subjective:    No chief complaint on file.   Gregory English is a 28 y.o. male who presents today for a complete physical exam. He reports consuming a general diet. The patient does not participate in regular exercise at present. He generally feels well. He reports sleeping well. He does not have additional problems to discuss today.    Most recent fall risk assessment:    06/18/2024   10:38 AM  Fall Risk   Falls in the past year? 0     Most recent depression screenings:    06/18/2024   10:37 AM 12/22/2015   11:12 AM  PHQ 2/9 Scores  PHQ - 2 Score 0 0  PHQ- 9 Score 0     Patient Care Team: Ysabel Stankovich, [place], [place] as PCP - General (Advice worker)   Outpatient Medications Prior to Visit  Medication Sig   [DISCONTINUED] ondansetron  (ZOFRAN ) 4 MG tablet Take 1 tablet (4 mg total) by mouth every 8 (eight) hours as needed.   [DISCONTINUED] ondansetron  (ZOFRAN -ODT) 4 MG disintegrating tablet 4mg  ODT q4 hours prn nausea/vomit   No facility-administered medications prior to visit.    Review of Systems  Constitutional:  Negative for chills, fever and malaise/fatigue.  Eyes:  Negative for blurred vision and double vision.  Respiratory:  Negative for cough and shortness of breath.   Cardiovascular:  Negative for chest pain and palpitations.  Musculoskeletal:  Negative for joint pain and myalgias.  Neurological:  Negative for dizziness and headaches.  Psychiatric/Behavioral:  Negative for depression. The patient is not nervous/anxious.        Objective:     BP 122/80   Pulse 63   Temp (!) 97.5 F (36.4 C)   Ht 6' (1.829 m)   Wt 169 lb (76.7 kg)   BMI 22.92 kg/m   Physical Exam Constitutional:      General: He is not in acute distress.    Appearance: Normal appearance. He is normal weight. He is not ill-appearing.  HENT:     Head: Normocephalic and  atraumatic.     Mouth/Throat:     Mouth: Mucous membranes are moist.     Pharynx: Oropharynx is clear.  Eyes:     Extraocular Movements: Extraocular movements intact.     Conjunctiva/sclera: Conjunctivae normal.  Cardiovascular:     Rate and Rhythm: Normal rate and regular rhythm.     Heart sounds: Normal heart sounds. No murmur heard. Pulmonary:     Effort: Pulmonary effort is normal.     Breath sounds: Normal breath sounds. No wheezing, rhonchi or rales.  Musculoskeletal:     Right lower leg: No edema.     Left lower leg: No edema.  Skin:    General: Skin is warm and dry.  Neurological:     General: No focal deficit present.     Mental Status: He is alert and oriented to person, place, and time.  Psychiatric:        Mood and Affect: Mood normal.        Behavior: Behavior normal.      No results found for any visits on 06/18/24.    Assessment & Plan:    Routine Health Maintenance and Physical Exam  Health Maintenance  Topic Date Due   HIV Screening  Never done   Hepatitis C Screening  Never done  HPV Vaccine (3 - Male 3-dose series) 05/02/2015   DTaP/Tdap/Td vaccine (7 - Td or Tdap) 02/25/2019   COVID-19 Vaccine (1 - 2025-26 season) Never done   Flu Shot  Completed   Hepatitis B Vaccine  Completed   Pneumococcal Vaccine  Aged Out   Meningitis B Vaccine  Aged Out    Discussed health benefits of physical activity, and encouraged him to engage in regular exercise appropriate for his age and condition.  Problem List Items Addressed This Visit   None Visit Diagnoses       Encounter to establish care    -  Primary     Annual visit for general adult medical examination without abnormal findings         Immunization due       Relevant Orders   Flu vaccine trivalent PF, 6mos and older(Flulaval,Afluria,Fluarix,Fluzone) (Completed)      Return in about 1 year (around 06/18/2025) for physical or sooner as needed.  Safety measures discussed. Immunizations reviewed:  flu shot given today Diet and exercise/ lifestyle modifications discussed: Recommend 150 minutes per week of exercise such as walking. Recommend lots of fresh produce to include fruits, vegetables, beans, healthy fats such as avocado, nuts, seeds, and 3-6 ounces of protein at each meal.  Avoid fried foods and fast food. Limit alcohol consumption: no more than one drink per day for women and 2 drinks per day for men.  Stress management discussed. Routine vision and dental screening discussed: recommend dentist every 6 months, gets vision checked every 1-2 years.  Health maintenance:  up to date Questions answered.       Charmaine Deannie Resetar, PA-C

## 2025-06-18 ENCOUNTER — Encounter: Admitting: Physician Assistant
